# Patient Record
Sex: Female | Born: 1995
Health system: Southern US, Community
[De-identification: ages and names within clinical notes are randomized; demographics above are authoritative.]

---
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
## 2022-01-17 NOTE — Progress Notes (Deleted)
   New Patient Office Visit  Subjective    Patient ID: Jade Roberts, female    DOB: 10/19/1995  Age: 26 y.o. MRN: [id number]  CC: No chief complaint on file.   HPI Jade Roberts presents for new patient visit to establish care.  Introduced to Designer, jewellery role and practice setting.  All questions answered.  Discussed provider/patient relationship and expectations.   Outpatient Encounter Medications as of 01/18/2022  Medication Sig   ferrous sulfate 325 (65 FE) MG tablet Take 1 tablet (325 mg total) by mouth daily.   No facility-administered encounter medications on file as of 01/18/2022.    No past medical history on file.  No past surgical history on file.  No family history on file.  Social History   Socioeconomic History   Marital status: Single    Spouse name: Not on file   Number of children: Not on file   Years of education: Not on file   Highest education level: Not on file  Occupational History   Not on file  Tobacco Use   Smoking status: Not on file   Smokeless tobacco: Not on file  Substance and Sexual Activity   Alcohol use: Not on file   Drug use: Not on file   Sexual activity: Not on file  Other Topics Concern   Not on file  Social History Narrative   Not on file   Social Determinants of Health   Financial Resource Strain: Not on file  Food Insecurity: Not on file  Transportation Needs: Not on file  Physical Activity: Not on file  Stress: Not on file  Social Connections: Not on file  Intimate Partner Violence: Not on file    ROS      Objective    There were no vitals taken for this visit.  Physical Exam  {Labs (Optional):23779}    Assessment & Plan:   Problem List Items Addressed This Visit   None   No follow-ups on file.   Charyl Dancer, NP

## 2022-01-18 ENCOUNTER — Telehealth: Payer: Self-pay | Admitting: Nurse Practitioner

## 2022-01-18 ENCOUNTER — Ambulatory Visit: Payer: Commercial Managed Care - PPO | Admitting: Nurse Practitioner

## 2022-01-18 NOTE — Telephone Encounter (Signed)
Pt was a no show for her NP app with Lauren on 01/18/22, I sent a no show letter.  ?

## 2022-02-09 NOTE — Telephone Encounter (Signed)
1st no show, fee waived ?

## 2024-05-20 ENCOUNTER — Ambulatory Visit (HOSPITAL_COMMUNITY)
Admission: EM | Admit: 2024-05-20 | Discharge: 2024-05-20 | Disposition: A | Discharge status: Home / Self Care | Payer: Self-pay | Attending: Physician Assistant | Admitting: Physician Assistant

## 2024-05-20 ENCOUNTER — Encounter (HOSPITAL_COMMUNITY): Payer: Self-pay

## 2024-05-20 DIAGNOSIS — H60392 Other infective otitis externa, left ear: Secondary | ICD-10-CM

## 2024-05-20 DIAGNOSIS — T162XXA Foreign body in left ear, initial encounter: Secondary | ICD-10-CM

## 2024-05-20 MED ORDER — OFLOXACIN 0.3 % OT SOLN: 5.0000 [drp] | Freq: Two times a day (BID) | OTIC | 0 refills | Status: AC

## 2024-05-20 NOTE — ED Triage Notes (Signed)
 Pt states that she has a cotton swab stuck in her left. X1 day  Pt denies any discomfort.

## 2024-05-20 NOTE — ED Provider Notes (Signed)
 [facility]    CSN: [id number] Arrival date & time: 05/20/24  1659      History   Chief Complaint Chief Complaint  Patient presents with   Foreign Body in Ear    HPI Jade Roberts is a 28 y.o. female.   Patient presents today with a 1 day history of fullness in her left ear.  Reports that she was using a cotton swab when part of the cotton dislodged.  She attempted to use her finger as well as a pair of tweezers to remove this but was unsuccessful.  She does report a sensation of fullness and can feel the consult moving but denies any associated pain.  She denies any fever, nausea, vomiting, otorrhea.    History reviewed. No pertinent past medical history.  There are no active problems to display for this patient.   History reviewed. No pertinent surgical history.  OB History   No obstetric history on file.      Home Medications    Prior to Admission medications   Medication Sig Start Date End Date Taking? Authorizing Provider  ofloxacin  (FLOXIN ) 0.3 % OTIC solution Place 5 drops into the left ear 2 (two) times daily. 05/20/24  Yes Kaitlin Alcindor, Rocky POUR, PA-C    Family History History reviewed. No pertinent family history.  Social History Social History   Tobacco Use   Smoking status: Never   Smokeless tobacco: Never  Vaping Use   Vaping status: Never Used  Substance Use Topics   Alcohol use: Yes    Comment: occ   Drug use: Never     Allergies   Patient has no known allergies.   Review of Systems Review of Systems  ROS per HPI  Physical Exam Triage Vital Signs ED Triage Vitals  Encounter Vitals Group     BP 05/20/24 1804 104/69     Girls Systolic BP Percentile --      Girls Diastolic BP Percentile --      Boys Systolic BP Percentile --      Boys Diastolic BP Percentile --      Pulse Rate 05/20/24 1804 92     Resp 05/20/24 1804 19     Temp 05/20/24 1804 98.8 F (37.1 C)     Temp Source 05/20/24 1804 Oral     SpO2 05/20/24 1804 97 %      Weight 05/20/24 1803 130 lb (59 kg)     Height 05/20/24 1803 5' 5 (1.651 m)     Head Circumference --      Peak Flow --      Pain Score 05/20/24 1803 0     Pain Loc --      Pain Education --      Exclude from Growth Chart --    No data found.  Updated Vital Signs BP 104/69 (BP Location: Right Arm)   Pulse 92   Temp 98.8 F (37.1 C) (Oral)   Resp 19   Ht 5' 5 (1.651 m)   Wt 130 lb (59 kg)   LMP 05/13/2024   SpO2 97%   BMI 21.63 kg/m   Visual Acuity Right Eye Distance:   Left Eye Distance:   Bilateral Distance:    Right Eye Near:   Left Eye Near:    Bilateral Near:     Physical Exam Vitals reviewed.  Constitutional:      General: She is awake. She is not in acute distress.    Appearance: Normal appearance. She  is well-developed. She is not ill-appearing.     Comments: Very pleasant female appears stated age in no acute distress sitting comfortably in exam room  HENT:     Head: Normocephalic and atraumatic.     Right Ear: Tympanic membrane, ear canal and external ear normal. Tympanic membrane is not erythematous or bulging.     Left Ear: Tympanic membrane and external ear normal. A foreign body is present.     Ears:     Comments: Left ear: Compacted cotton noted deep in external auditory canal.  Unable to visualize TM.  Following irrigation the foreign body was removed but patient had significant maceration of external auditory canal.    Mouth/Throat:     Pharynx: Uvula midline. No oropharyngeal exudate or posterior oropharyngeal erythema.  Cardiovascular:     Rate and Rhythm: Normal rate and regular rhythm.     Heart sounds: Normal heart sounds, S1 normal and S2 normal. No murmur heard. Pulmonary:     Effort: Pulmonary effort is normal.     Breath sounds: Normal breath sounds. No wheezing, rhonchi or rales.     Comments: Clear to auscultation bilaterally Psychiatric:        Behavior: Behavior is cooperative.      UC Treatments / Results  Labs (all  labs ordered are listed, but only abnormal results are displayed) Labs Reviewed - No data to display  EKG   Radiology No results found.  Procedures Procedures (including critical care time)  Medications Ordered in UC Medications - No data to display  Initial Impression / Assessment and Plan / UC Course  I have reviewed the triage vital signs and the nursing notes.  Pertinent labs & imaging results that were available during my care of the patient were reviewed by me and considered in my medical decision making (see chart for details).     Patient is well-appearing, afebrile, nontoxic, nontachycardic.  We were able to remove cotton with irrigation and patient had improvement of symptoms.  She did have significant maceration of the external auditory canal so we will start ofloxacin  drops twice daily for a week.  Discussed that she is not to put anything in the ear and avoid submerging head in water until she finishes the medication.  We discussed that if anything worsens she has increasing pain, drainage, fever, nausea, vomiting she is to return for reevaluation.  Strict return precautions given.  Final Clinical Impressions(s) / UC Diagnoses   Final diagnoses:  Infective otitis externa of left ear  Acute foreign body of left ear canal, initial encounter     Discharge Instructions      We were able to remove the cotton.  Your ear canal was irritated which I suspect is related to as washing out the cotton but I would like you to use an antibiotic drop twice daily for the next week.  Do not put anything in your ear including earbuds, Q-tips, earplugs I do not submerge your head in water until you finish using this medication.  If anything changes and you have increasing pain, drainage, fever, nausea, vomiting you need to be seen immediately.     ED Prescriptions     Medication Sig Dispense Auth. Provider   ofloxacin  (FLOXIN ) 0.3 % OTIC solution Place 5 drops into the left ear 2  (two) times daily. 5 mL Lila Lufkin K, PA-C      PDMP not reviewed this encounter.   Sherrell Rocky POUR, PA-C 05/20/24 1902

## 2024-05-20 NOTE — Discharge Instructions (Signed)
 We were able to remove the cotton.  Your ear canal was irritated which I suspect is related to as washing out the cotton but I would like you to use an antibiotic drop twice daily for the next week.  Do not put anything in your ear including earbuds, Q-tips, earplugs I do not submerge your head in water until you finish using this medication.  If anything changes and you have increasing pain, drainage, fever, nausea, vomiting you need to be seen immediately.
